# Patient Record
Sex: Male | Born: 1960 | Race: Black or African American | Hispanic: No | Marital: Single | Smoking: Former smoker
Health system: Southern US, Community
[De-identification: ages and names within clinical notes are randomized; demographics above are authoritative.]

---

## 2008-04-29 ENCOUNTER — Ambulatory Visit (HOSPITAL_COMMUNITY): Admission: RE | Admit: 2008-04-29 | Discharge: 2008-04-29 | Payer: Self-pay | Admitting: *Deleted

## 2017-02-20 ENCOUNTER — Encounter (HOSPITAL_COMMUNITY): Payer: Self-pay | Admitting: Emergency Medicine

## 2017-02-20 ENCOUNTER — Emergency Department (HOSPITAL_COMMUNITY)
Admission: EM | Admit: 2017-02-20 | Discharge: 2017-02-20 | Disposition: A | Discharge status: Home / Self Care | Payer: 59 | Attending: Emergency Medicine | Admitting: Emergency Medicine

## 2017-02-20 DIAGNOSIS — Z880 Allergy status to penicillin: Secondary | ICD-10-CM | POA: Insufficient documentation

## 2017-02-20 DIAGNOSIS — H9203 Otalgia, bilateral: Secondary | ICD-10-CM | POA: Diagnosis not present

## 2017-02-20 DIAGNOSIS — J029 Acute pharyngitis, unspecified: Secondary | ICD-10-CM | POA: Insufficient documentation

## 2017-02-20 DIAGNOSIS — B349 Viral infection, unspecified: Secondary | ICD-10-CM | POA: Insufficient documentation

## 2017-02-20 LAB — RAPID STREP SCREEN (MED CTR MEBANE ONLY): STREPTOCOCCUS, GROUP A SCREEN (DIRECT): NEGATIVE

## 2017-02-20 MED ORDER — METHOCARBAMOL 500 MG PO TABS: 500.0000 mg | ORAL_TABLET | Freq: Two times a day (BID) | ORAL | 0 refills | Status: DC

## 2017-02-20 MED ORDER — DEXAMETHASONE SODIUM PHOSPHATE 10 MG/ML IJ SOLN
10.0000 mg | Freq: Once | INTRAMUSCULAR | Status: AC
  Administered 2017-02-20: 10 mg via INTRAMUSCULAR
  Filled 2017-02-20: qty 1

## 2017-02-20 MED ORDER — METHOCARBAMOL 500 MG PO TABS
500.0000 mg | ORAL_TABLET | Freq: Once | ORAL | Status: AC
  Administered 2017-02-20: 500 mg via ORAL
  Filled 2017-02-20: qty 1

## 2017-02-20 NOTE — ED Provider Notes (Signed)
[facility]-EMERGENCY DEPT Provider Note   CSN: [id number] Arrival date & time: 02/20/17  1313     History   Chief Complaint Chief Complaint  Patient presents with  . Sore Throat  . Ear Pain    HPI Jerome Griffith is a 56 y.o. male who presents with 2 days of sore throat and bilateral ear pain. Patient also reports generalized malaise patient states that he feels weak, tired and sore. His been taking Aleve with minimal relief. Patient states that he has been able to tolerate his secretions and PO. He denies any difficulty swelling but states that his pain is worsened with swallowing. Patient reports subjective fever chills last night but did not actually measure a temperature. Patient reports that he had dental bridge inserted inserted on 02/15/17 but that it was simply putting in a mouth piece and did not require any surgery. Patient notes that yesterday  he had a rash/irritation to the posterior neck where his headphones sit across his neck. He also notes yesterday  he had a rash to the left side of his groin. No penile discharge or pain. Patient is currently sexually active with women. He denies any oral sex. He denies any recent insect bites. Patient denies any headache, difficulty breathing, chest pain, vision changes, nausea/vomiting, dysuria, hematuria, facial or neck swelling.  The history is provided by the patient.    No past medical history on file.  There are no active problems to display for this patient.   No past surgical history on file.     Home Medications    Prior to Admission medications   Medication Sig Start Date End Date Taking? Authorizing Provider  methocarbamol (ROBAXIN) 500 MG tablet Take 1 tablet (500 mg total) by mouth 2 (two) times daily. 02/20/17   Maxwell CaulLayden, Javaya [place] A, PA-C    Family History No family history on file.  Social History Social History  Substance Use Topics  . Smoking status: Former Games developermoker  . Smokeless tobacco: Not on file  . Alcohol use  No     Allergies   Penicillins   Review of Systems Review of Systems  Constitutional: Positive for fever (subjective). Negative for chills.  HENT: Positive for ear pain and sore throat. Negative for congestion, facial swelling, trouble swallowing and voice change.   Eyes: Negative for visual disturbance.  Respiratory: Negative for cough and shortness of breath.   Cardiovascular: Negative for chest pain.  Gastrointestinal: Negative for abdominal pain, diarrhea, nausea and vomiting.  Genitourinary: Negative for discharge, dysuria, hematuria, penile pain and penile swelling.  Musculoskeletal: Positive for myalgias. Negative for back pain and neck pain.  Skin: Positive for rash.  Neurological: Negative for dizziness, weakness and numbness.     Physical Exam Updated Vital Signs BP (!) 132/92 (BP Location: Right Arm)   Pulse 94   Temp 99.4 F (37.4 C) (Oral)   Resp 16   SpO2 100%   Physical Exam  Constitutional: He is oriented to person, place, and time. He appears well-developed and well-nourished.  Sitting comfortably on examination table  HENT:  Head: Normocephalic and atraumatic.  Right Ear: Tympanic membrane normal. No mastoid tenderness. Tympanic membrane is not erythematous. No hemotympanum.  Left Ear: Tympanic membrane normal. No mastoid tenderness. Tympanic membrane is not erythematous. No hemotympanum.  Mouth/Throat: Uvula is midline and mucous membranes are normal. Oropharyngeal exudate, posterior oropharyngeal edema and posterior oropharyngeal erythema present.  Uvula is midline. No trismus. No evidence of peritonsillar absces. No tenderness to palpation of  the mastoid process bilaterally. No overlying warmth or erythema noted.  Eyes: Pupils are equal, round, and reactive to light. Conjunctivae, EOM and lids are normal.  Neck: Full passive range of motion without pain. Neck supple. No neck rigidity.  Cardiovascular: Normal rate, regular rhythm, normal heart sounds and  normal pulses.  Exam reveals no gallop and no friction rub.   No murmur heard. Pulmonary/Chest: Effort normal and breath sounds normal.  No evidence of respiratory distress. Able to speak in full sentences without difficulty.  Abdominal: Soft. Normal appearance. There is no tenderness. There is no rigidity and no guarding. Hernia confirmed negative in the right inguinal area and confirmed negative in the left inguinal area.  Genitourinary: Testes normal and penis normal. Right testis shows no mass and no tenderness. Left testis shows no mass and no tenderness. Circumcised. No penile erythema.  Genitourinary Comments: The exam was performed with a chaperone present. Normal male genitalia. Penis without any evidence of lesions, rash or ulcers. No rash to the groin noted.   Musculoskeletal: Normal range of motion.  Neurological: He is alert and oriented to person, place, and time.  Skin: Skin is warm and dry. Capillary refill takes less than 2 seconds.  Small curvature distribution of papules on the posterior neck where patient lays his headphones. No rash noted to anterior chest, back, abdomen, extremities. No rash noted to palms of hands or soles of feet.  Psychiatric: He has a normal mood and affect. His speech is normal.  Nursing note and vitals reviewed.    ED Treatments / Results  Labs (all labs ordered are listed, but only abnormal results are displayed) Labs Reviewed  RAPID STRE[facility]EN (NOT [facility])  CULTURE, GROUP A STREP [facility])    EKG  EKG Interpretation None       Radiology No results found.  Procedures Procedures (including critical care time)  Medications Ordered in ED Medications  methocarbamol (ROBAXIN) tablet 500 mg (500 mg Oral Given 02/20/17 1526)  dexamethasone (DECADRON) injection 10 mg (10 mg Intramuscular Given 02/20/17 1546)     Initial Impression / Assessment and Plan / ED Course  I have reviewed the triage vital signs and the nursing notes.  Pertinent  labs & imaging results that were available during my care of the patient were reviewed by me and considered in my medical decision making (see chart for details).     56 year old male who presents with 2 days of general malaise, sore throat, bilateral ear pain. Patient is afebrile, non-toxic appearing, sitting comfortably on examination table. Vital signs reviewed and stable. Consider pharyngitis versus tonsillitis versus upper respiratory syndrome versus viral syndrome. History/physical exam are not concerning for pneumonia, peritonsillar abscess, Ludwig angina. Patient did have a dental bridge inserted yesterday but did not require any surgery. Evidence of acute otitis media or mastoiditis. Plan to order a rapid strep for evaluation. Discussed patient with Dr. Ethelda ChickJacubowitz. Agrees with plan.   Patient did complain of some rash to the posterior neck and groin area. The neck appears to be consistent with contact dermatitis. It is in the distribution of where he wears his headphones and nowhere else. On GU exam, there is no evidence of rash. Patient himself states that the rash has disappeared. History/physical exam are not concerning for RMSF.  Rapid strep is negative. Discussed results with patient. He is asking for something for pain and to help with the knowledge muscle aches. He is asking to have some Tamiflu. Explained to him that we  will not give any Tamiflu since it is not indicated in this incident. Will plan to give a short course of muscle relaxers to help with pain. Patient also requesting that we get something to help make swallowing easier. Patient given a dose of Decadron here in the department. Patient is stable for discharge at this time. He is hemodynamically stable. He has been able to tolerate PO while in the department. Provided patient with a list of clinic resources to use if he does not have a PCP. Instructed to call them today to arrange follow-up in the next 24-48 hours. Strict eturn  precautions discussed. Patient expresses understanding and agreement to plan.    Final Clinical Impressions(s) / ED Diagnoses   Final diagnoses:  Viral pharyngitis  Viral syndrome    New Prescriptions Discharge Medication List as of 02/20/2017  3:41 PM    START taking these medications   Details  methocarbamol (ROBAXIN) 500 MG tablet Take 1 tablet (500 mg total) by mouth 2 (two) times daily., Starting Wed 02/20/2017, Print         Maxwell Caul, PA-C 02/20/17 1741    Doug Sou, MD 02/21/17 [phone]

## 2017-02-20 NOTE — ED Notes (Signed)
See edp assessment 

## 2017-02-20 NOTE — Discharge Instructions (Signed)
You can take Tylenol or Ibuprofen as directed for pain. You can alternate Tylenol and Ibuprofen every 4 hours for additional pain relief.   Take Robaxin as directed.   Make sure you are drinking plenty of fluids and staying hydrated.   Follow-up with your primary care doctor in 2 days. Call their office and let them know you were seen in the ED. If you do not have one you can use one provided in the paper work below.   Return to the Emergency Department for any worsening pain, fever, difficulty swallowing, difficulty breathing, difficulty eating, neck or facial swelling or any other worsening or concerning symptoms.

## 2017-02-20 NOTE — ED Provider Notes (Signed)
[place] of sore throat pain on swallowing and bilateral ear pain. Onset 2 days ago. Denies cough denies shortness of breath. On exam alert no distress handling secretions well. Uvula midline. There is whitish exudate on soft palate and on uvula. Bilateral tympanic membranes normal.   Doug SouJacubowitz, Elycia Woodside, MD 02/20/17 1430

## 2017-02-20 NOTE — ED Triage Notes (Signed)
Pt arrives c/o sorethroat, bodyaches, fever for the last 2 days. States has tried Ryder Systemaleve with little relief. VSS, pt NAD at present.

## 2017-02-20 NOTE — ED Notes (Signed)
ED Provider at bedside. 

## 2017-02-22 LAB — CULTURE, GROUP A STREP (THRC)

## 2018-01-20 ENCOUNTER — Other Ambulatory Visit: Payer: Self-pay

## 2018-01-20 ENCOUNTER — Encounter (HOSPITAL_COMMUNITY): Payer: Self-pay | Admitting: Emergency Medicine

## 2018-01-20 ENCOUNTER — Emergency Department (HOSPITAL_COMMUNITY): Payer: 59

## 2018-01-20 ENCOUNTER — Emergency Department (HOSPITAL_COMMUNITY)
Admission: EM | Admit: 2018-01-20 | Discharge: 2018-01-20 | Disposition: A | Discharge status: Home / Self Care | Payer: 59 | Attending: Emergency Medicine | Admitting: Emergency Medicine

## 2018-01-20 DIAGNOSIS — Y939 Activity, unspecified: Secondary | ICD-10-CM | POA: Diagnosis not present

## 2018-01-20 DIAGNOSIS — Y929 Unspecified place or not applicable: Secondary | ICD-10-CM | POA: Insufficient documentation

## 2018-01-20 DIAGNOSIS — S81811A Laceration without foreign body, right lower leg, initial encounter: Secondary | ICD-10-CM | POA: Diagnosis present

## 2018-01-20 DIAGNOSIS — Z87891 Personal history of nicotine dependence: Secondary | ICD-10-CM | POA: Insufficient documentation

## 2018-01-20 DIAGNOSIS — Y999 Unspecified external cause status: Secondary | ICD-10-CM | POA: Insufficient documentation

## 2018-01-20 DIAGNOSIS — Z23 Encounter for immunization: Secondary | ICD-10-CM | POA: Diagnosis not present

## 2018-01-20 DIAGNOSIS — W25XXXA Contact with sharp glass, initial encounter: Secondary | ICD-10-CM | POA: Diagnosis not present

## 2018-01-20 MED ORDER — LIDOCAINE HCL 2 % IJ SOLN
10.0000 mL | Freq: Once | INTRAMUSCULAR | Status: AC
  Administered 2018-01-20: 200 mg
  Filled 2018-01-20: qty 20

## 2018-01-20 MED ORDER — TETANUS-DIPHTH-ACELL PERTUSSIS 5-2.5-18.5 LF-MCG/0.5 IM SUSP
0.5000 mL | Freq: Once | INTRAMUSCULAR | Status: AC
  Administered 2018-01-20: 0.5 mL via INTRAMUSCULAR
  Filled 2018-01-20: qty 0.5

## 2018-01-20 NOTE — ED Notes (Signed)
Pt verbalizes understanding of d/c instructions. Pt ambulatory at d/c with all belongings.   

## 2018-01-20 NOTE — ED Provider Notes (Signed)
Patient placed in Quick Look pathway, seen and evaluated   Chief Complaint:  Leg laceration  HPI:    Patient states that he was cleaning out his storage container when a portion of glass attached to an armoire  [place]ell into a trash can. He attempted to lift the trash bag and in the process sustained a laceration to his right  Lower extremity with what he believes to be glass. Endorses mild pain. Denies any numbness or tingling. Unsure of his tetanus is up-to-date. He is not on blood thinners.  ROS:  positive for wound  Negative for numbness, tingling  Physical Exam:   Gen: No distress  Neuro: Awake and Alert  Skin: Warm    Focused Exam:  2+ DP/PT pulses bilaterally. 6 cm linear laceration to the anterior right lower extremity distal to the knee. Subcutaneous fat visible. Actively bleeding.   Initiation of care has begun. The patient has been counseled on the process, plan, and necessity for staying for the completion/evaluation, and the remainder of the medical screening examination    Bennye AlmFawze, Jerriyah Louis A, PA-C 01/20/18 2036    Mesner, Barbara CowerJason, MD 01/20/18 2142

## 2018-01-20 NOTE — ED Notes (Signed)
See EDP assessment / note.   

## 2018-01-20 NOTE — Discharge Instructions (Addendum)
Suture removal in 8 days  °

## 2018-01-20 NOTE — ED Triage Notes (Signed)
Patient to ED c/o R lower leg laceration (approximately 3 inches) - states he picked up a bag of glass and accidentally brushed it up against his leg. Patient applied pressure by tying a sheet - wound still bleeding upon removal, pressure bandage applied in triage. Patient reports tingling pain with walking. CSM intact distal to injury. Ambulatory without difficulty. Not on blood thinners.

## 2018-01-20 NOTE — ED Notes (Signed)
See EDP assessment 

## 2018-01-21 NOTE — ED Provider Notes (Signed)
[facility] Provider Note   CSN: [id number] Arrival date & time: 01/20/18  2014     History   Chief Complaint Chief Complaint  Patient presents with  . Laceration    HPI Jerome Griffith is a 57 y.o. male.  The history is provided by the patient. No language interpreter was used.  Laceration   The incident occurred 1 to 2 hours ago. The laceration is located on the right leg. The laceration is 8 cm in size. The laceration mechanism was a broken glass. The pain is mild. The pain has been constant since onset. He reports no foreign bodies present. His tetanus status is out of date.   Pt reports he cut his leg over a piece of broken glass in a garbage bag.  History reviewed. No pertinent past medical history.  There are no active problems to display for this patient.   History reviewed. No pertinent surgical history.      Home Medications    Prior to Admission medications   Medication Sig Start Date End Date Taking? Authorizing Provider  methocarbamol (ROBAXIN) 500 MG tablet Take 1 tablet (500 mg total) by mouth 2 (two) times daily. 02/20/17   Maxwell CaulLayden, Lindsey A, PA-C    Family History No family history on file.  Social History Social History   Tobacco Use  . Smoking status: Former Smoker  Substance Use Topics  . Alcohol use: No  . Drug use: No     Allergies   Penicillins   Review of Systems Review of Systems  Skin: Positive for wound.  All other systems reviewed and are negative.    Physical Exam Updated Vital Signs BP (!) 140/95 (BP Location: Right Arm)   Pulse 68   Temp 98.2 F (36.8 C) (Oral)   Resp 16   SpO2 99%   Physical Exam  Constitutional: He is oriented to person, place, and time. He appears well-developed and well-nourished.  HENT:  Head: Normocephalic.  Musculoskeletal: He exhibits tenderness.  8cm laceration right mid lower leg, gapping  From nv and nv and ns intact   Neurological: He is  alert and oriented to person, place, and time.  Psychiatric: He has a normal mood and affect.  Nursing note and vitals reviewed.    ED Treatments / Results  Labs (all labs ordered are listed, but only abnormal results are displayed) Labs Reviewed - No data to display  EKG None  Radiology Dg Tibia/fibula Right  Result Date: 01/20/2018 CLINICAL DATA:  57 year old male with laceration of the proximal tibia. EXAM: RIGHT TIBIA AND FIBULA - 2 VIEW COMPARISON:  None. FINDINGS: There is no acute fracture or dislocation. The bones are well mineralized. Mild arthritic changes of the knee. There is laceration of the skin of the anterior aspect of the proximal tibia. No radiopaque foreign object. IMPRESSION: 1. No acute/traumatic osseous pathology. 2. Laceration of the skin over the proximal tibia. No radiopaque foreign object. Electronically Signed   By: Elgie CollardArash  Radparvar M.D.   On: 01/20/2018 21:46    Procedures .Marland Kitchen.Laceration Repair Date/Time: 01/21/2018 1:44 AM Performed by: Elson AreasSofia, Duglas Heier K, PA-C Authorized by: Elson AreasSofia, Gladys Gutman K, PA-C   Consent:    Consent obtained:  Verbal   Consent given by:  Patient   Risks discussed:  Pain   Alternatives discussed:  No treatment Laceration details:    Location:  Leg   Length (cm):  8   Depth (mm):  5 Repair type:  Repair type:  Simple Pre-procedure details:    Preparation:  Patient was prepped and draped in usual sterile fashion Exploration:    Wound exploration: wound explored through full range of motion and entire depth of wound probed and visualized     Contaminated: no   Treatment:    Area cleansed with:  Betadine   Irrigation solution:  Sterile saline   Irrigation method:  Syringe   Visualized foreign bodies/material removed: no   Skin repair:    Repair method:  Sutures   Suture size:  4-0   Suture material:  Prolene   Suture technique:  Simple interrupted Approximation:    Approximation:  Loose Post-procedure details:    Patient  tolerance of procedure:  Tolerated well, no immediate complications   (including critical care time)  Medications Ordered in ED Medications  Tdap (BOOSTRIX) injection 0.5 mL (0.5 mLs Intramuscular Given 01/20/18 2345)  lidocaine (XYLOCAINE) 2 % (with pres) injection 200 mg (200 mg Infiltration Given by Other 01/20/18 2309)     Initial Impression / Assessment and Plan / ED Course  I have reviewed the triage vital signs and the nursing notes.  Pertinent labs & imaging results that were available during my care of the patient were reviewed by me and considered in my medical decision making (see chart for details).     Suture removal in 8 days.    Final Clinical Impressions(s) / ED Diagnoses   Final diagnoses:  Laceration of right lower extremity, initial encounter    ED Discharge Orders    None    An After Visit Summary was printed and given to the patient.    Elson Areas, PA-C 01/21/18 0145    Arby Barrette, MD 01/22/18 [phone]

## 2018-06-24 ENCOUNTER — Encounter (HOSPITAL_COMMUNITY): Payer: Self-pay | Admitting: *Deleted

## 2018-06-24 ENCOUNTER — Emergency Department (HOSPITAL_COMMUNITY)
Admission: EM | Admit: 2018-06-24 | Discharge: 2018-06-24 | Disposition: A | Discharge status: Home / Self Care | Payer: 59 | Attending: Emergency Medicine | Admitting: Emergency Medicine

## 2018-06-24 ENCOUNTER — Other Ambulatory Visit: Payer: Self-pay

## 2018-06-24 DIAGNOSIS — K029 Dental caries, unspecified: Secondary | ICD-10-CM | POA: Insufficient documentation

## 2018-06-24 DIAGNOSIS — K047 Periapical abscess without sinus: Secondary | ICD-10-CM

## 2018-06-24 DIAGNOSIS — K0381 Cracked tooth: Secondary | ICD-10-CM | POA: Insufficient documentation

## 2018-06-24 DIAGNOSIS — Z79899 Other long term (current) drug therapy: Secondary | ICD-10-CM | POA: Insufficient documentation

## 2018-06-24 DIAGNOSIS — Z87891 Personal history of nicotine dependence: Secondary | ICD-10-CM | POA: Insufficient documentation

## 2018-06-24 MED ORDER — CLINDAMYCIN HCL 150 MG PO CAPS: 300.0000 mg | ORAL_CAPSULE | Freq: Three times a day (TID) | ORAL | 0 refills | Status: DC

## 2018-06-24 MED ORDER — NAPROXEN 500 MG PO TABS: 500.0000 mg | ORAL_TABLET | Freq: Two times a day (BID) | ORAL | 0 refills | Status: DC

## 2018-06-24 NOTE — Discharge Instructions (Signed)
Take the prescribed medication as directed. Follow-up with your dentist on Friday as scheduled. Return to the ED for new or worsening symptoms.

## 2018-06-24 NOTE — ED Triage Notes (Signed)
Pt reports R upper dental pain since yesterday.  Started to having facial swelling today.  He has a bad tooth and is getting it pulled this Friday.

## 2018-06-24 NOTE — ED Provider Notes (Signed)
[facility] Provider Note   CSN: [id number] Arrival date & time: 06/24/18  2154     History   Chief Complaint Chief Complaint  Patient presents with  . Dental Pain    HPI Jerome Griffith is a 57 y.o. male.  The history is provided by the patient and medical records.  Dental Pain       57 year old male presented to the ED with right upper dental pain.  Reports yesterday when he woke up he noticed that his face and a little swollen he has increased pain in his right upper teeth.  Reports tooth broke quite a while ago but is never had any issues until recently.  He denies any fever.  States he does have increased pain when the air touches his tooth or when trying to chew on his right side.  He denies any difficulty swallowing.  States he has a dentist appointment on Friday but cannot wait until then.  He has not tried anything for symptoms at home.  History reviewed. No pertinent past medical history.  There are no active problems to display for this patient.   History reviewed. No pertinent surgical history.      Home Medications    Prior to Admission medications   Medication Sig Start Date End Date Taking? Authorizing Provider  methocarbamol (ROBAXIN) 500 MG tablet Take 1 tablet (500 mg total) by mouth 2 (two) times daily. 02/20/17   Maxwell CaulLayden, Lindsey A, PA-C    Family History No family history on file.  Social History Social History   Tobacco Use  . Smoking status: Former Games developermoker  . Smokeless tobacco: Never Used  Substance Use Topics  . Alcohol use: No  . Drug use: No     Allergies   Penicillins   Review of Systems Review of Systems  HENT: Positive for dental problem.   All other systems reviewed and are negative.    Physical Exam Updated Vital Signs BP (!) 175/101 (BP Location: Left Arm)   Pulse (!) 102   Temp 98.6 F (37 C) (Oral)   Resp 18   SpO2 98%   Physical Exam  Constitutional: He is oriented to  person, place, and time. He appears well-developed and well-nourished.  HENT:  Head: Normocephalic and atraumatic.  Mouth/Throat: Oropharynx is clear and moist.  Teeth largely in poor dentition, tooth 13 and 14 are broken and severely decayed, surrounding gingiva swollen with extension into the inner cheek, handling secretions appropriately, no trismus, mid swelling to right mid-face without extension into the neck, normal phonation without stridor  Eyes: Pupils are equal, round, and reactive to light. Conjunctivae and EOM are normal.  Neck: Normal range of motion.  Cardiovascular: Normal rate, regular rhythm and normal heart sounds.  Pulmonary/Chest: Effort normal and breath sounds normal.  Abdominal: Soft. Bowel sounds are normal.  Musculoskeletal: Normal range of motion.  Neurological: He is alert and oriented to person, place, and time.  Skin: Skin is warm and dry.  Psychiatric: He has a normal mood and affect.  Nursing note and vitals reviewed.    ED Treatments / Results  Labs (all labs ordered are listed, but only abnormal results are displayed) Labs Reviewed - No data to display  EKG None  Radiology No results found.  Procedures Procedures (including critical care time)  Medications Ordered in ED Medications - No data to display   Initial Impression / Assessment and Plan / ED Course  I have reviewed the  triage vital signs and the nursing notes.  Pertinent labs & imaging results that were available during my care of the patient were reviewed by me and considered in my medical decision making (see chart for details).  57 year old male here with right upper dental pain.  Has 2 teeth that are severely decayed with gingival swelling.  He is maintaining his airway and has normal phonation without stridor.  No difficulty swallowing, handling secretions well.  No signs or symptoms suggestive of Ludwig's angina at this time.  Will start on antibiotics and have him follow-up  with his dentist on Friday as scheduled.  He will return here for any new or worsening symptoms.  Final Clinical Impressions(s) / ED Diagnoses   Final diagnoses:  Dental infection    ED Discharge Orders         Ordered    clindamycin (CLEOCIN) 150 MG capsule  3 times daily     06/24/18 2330    naproxen (NAPROSYN) 500 MG tablet  2 times daily with meals     06/24/18 2330           Garlon Hatchet, PA-C 06/24/18 2341    Gilda Crease, MD 06/25/18 [phone]

## 2018-06-25 ENCOUNTER — Emergency Department (HOSPITAL_COMMUNITY)
Admission: EM | Admit: 2018-06-25 | Discharge: 2018-06-25 | Disposition: A | Discharge status: Home / Self Care | Payer: Self-pay | Attending: Emergency Medicine | Admitting: Emergency Medicine

## 2018-06-25 ENCOUNTER — Encounter (HOSPITAL_COMMUNITY): Payer: Self-pay | Admitting: Emergency Medicine

## 2018-06-25 DIAGNOSIS — T7840XA Allergy, unspecified, initial encounter: Secondary | ICD-10-CM | POA: Insufficient documentation

## 2018-06-25 DIAGNOSIS — Z87891 Personal history of nicotine dependence: Secondary | ICD-10-CM | POA: Insufficient documentation

## 2018-06-25 DIAGNOSIS — Z79899 Other long term (current) drug therapy: Secondary | ICD-10-CM | POA: Insufficient documentation

## 2018-06-25 MED ORDER — DIPHENHYDRAMINE HCL 25 MG PO CAPS
50.0000 mg | ORAL_CAPSULE | Freq: Once | ORAL | Status: AC
Start: 2018-06-25 — End: 2018-06-25
  Administered 2018-06-25: 50 mg via ORAL
  Filled 2018-06-25: qty 2

## 2018-06-25 MED ORDER — DEXAMETHASONE SODIUM PHOSPHATE 10 MG/ML IJ SOLN
10.0000 mg | Freq: Once | INTRAMUSCULAR | Status: AC
  Administered 2018-06-25: 10 mg via INTRAMUSCULAR
  Filled 2018-06-25: qty 1

## 2018-06-25 NOTE — ED Triage Notes (Signed)
Pt presents to ED for assessment of facial swelling, continuing since last night, and worse when he woke up this morning.  States his eyes were swollen shut, c/o increased swelling to gums.  States he took one dose of naproxen and clindamycin.  Patient denies SOB.

## 2018-06-25 NOTE — ED Notes (Addendum)
Pt here last night for dental pain and prescribed naproxen and clindamycin, pt also "clove" and "curamin" from [facility] which is a herbal store. Pt then woke up with severe facial swelling/edema. No itching or rash. Pt thinks he had an allergic reaction to 1 of the 4 medications. Airway intact. VSS, NAD.

## 2018-06-26 NOTE — ED Provider Notes (Signed)
[facility] EMERGENCY DEPARTMENT Provider Note   CSN: [id number] Arrival date & time: 06/25/18  1600     History   Chief Complaint Chief Complaint  Patient presents with  . Facial Swelling  . Allergic Reaction    HPI Jerome Griffith is a 57 y.o. male.  HPI  57 year old male comes in with chief complaint of facial swelling and allergic reaction. Patient was seen in the ER earlier and was given clindamycin for facial swelling that was thought to be because of dental infection.  Patient states that he took clindamycin after being discharged from the hospital last night.  When he woke up he noted that his entire his face was swollen, and he decided to come to the ER.  Patient denies any tongue swelling, shortness of breath, rash, difficulty in breathing.  History reviewed. No pertinent past medical history.  There are no active problems to display for this patient.   History reviewed. No pertinent surgical history.      Home Medications    Prior to Admission medications   Medication Sig Start Date End Date Taking? Authorizing Provider  clindamycin (CLEOCIN) 150 MG capsule Take 2 capsules (300 mg total) by mouth 3 (three) times daily. May dispense as 150mg  capsules 06/24/18   Garlon Hatchet, PA-C  methocarbamol (ROBAXIN) 500 MG tablet Take 1 tablet (500 mg total) by mouth 2 (two) times daily. 02/20/17   Maxwell Caul, PA-C  naproxen (NAPROSYN) 500 MG tablet Take 1 tablet (500 mg total) by mouth 2 (two) times daily with a meal. 06/24/18   Garlon Hatchet, PA-C    Family History History reviewed. No pertinent family history.  Social History Social History   Tobacco Use  . Smoking status: Former Games developer  . Smokeless tobacco: Never Used  Substance Use Topics  . Alcohol use: No  . Drug use: No     Allergies   Penicillins   Review of Systems Review of Systems  Constitutional: Positive for activity change.  Respiratory: Negative for shortness  of breath.   Skin: Negative for rash.  Allergic/Immunologic: Negative for environmental allergies, food allergies and immunocompromised state.     Physical Exam Updated Vital Signs BP (!) 141/90 (BP Location: Right Arm)   Pulse 86   Temp 97.9 F (36.6 C) (Oral)   Resp 17   SpO2 98%   Physical Exam  Constitutional: He is oriented to person, place, and time. He appears well-developed.  HENT:  Head: Atraumatic.  Patient has generalized facial swelling on both sides on the right appears slightly worse than left. There is no abscess for [place] to drain over the gingiva or the right upper quadrant where patient has eroded tooth.  Eyes: Pupils are equal, round, and reactive to light. EOM are normal.  Neck: Neck supple.  Cardiovascular: Normal rate.  Pulmonary/Chest: Effort normal. No stridor. He has no wheezes.  Neurological: He is alert and oriented to person, place, and time.  Skin: Skin is warm. No rash noted.  Nursing note and vitals reviewed.    ED Treatments / Results  Labs (all labs ordered are listed, but only abnormal results are displayed) Labs Reviewed - No data to display  EKG None  Radiology No results found.  Procedures Procedures (including critical care time)  Medications Ordered in ED Medications  dexamethasone (DECADRON) injection 10 mg (10 mg Intramuscular Given 06/25/18 2101)  diphenhydrAMINE (BENADRYL) capsule 50 mg (50 mg Oral Given 06/25/18 2102)  Initial Impression / Assessment and Plan / ED Course  I have reviewed the triage vital signs and the nursing notes.  Pertinent labs & imaging results that were available during my care of the patient were reviewed by me and considered in my medical decision making (see chart for details).     Patient comes in with chief complaint of facial swelling.  He was started on clindamycin and Naprosyn yesterday for dental infection.  He states that he woke up in the morning with worsening of the  swelling.  Patient did not have any angioedema.  I informed him that his symptoms could have been because of worsening infection versus allergic reaction to the medication.  Since patient started taking Naprosyn and clindamycin at the same time, it is unclear what might have caused him to have allergic reaction.  Patient is not comfortable taking the medication suspecting that this was likely an allergic type reaction.  We will give him IM Decadron and Benadryl and reassess.  LATE ENTRY: On reassessment patient swelling is come down.  He has informed me that he is probably not going to take any of the medication as prescribed.  He has an appointment with dentist coming up.  I discussed strict ER return precautions and patient is comfortable with plan for discharge.  Final Clinical Impressions(s) / ED Diagnoses   Final diagnoses:  Allergic reaction, initial encounter    ED Discharge Orders    None       Derwood KaplanNanavati, Bernetta Sutley, MD 06/26/18 [phone]

## 2018-10-21 IMAGING — DX DG TIBIA/FIBULA 2V*R*
4 series · 4 of 4 positions shown · non-contrast
Comparison: None.

CLINICAL DATA: 57-year-old male with laceration of the proximal
tibia.

EXAM:
RIGHT TIBIA AND FIBULA - 2 VIEW

[tibia ap (1 of 2)]
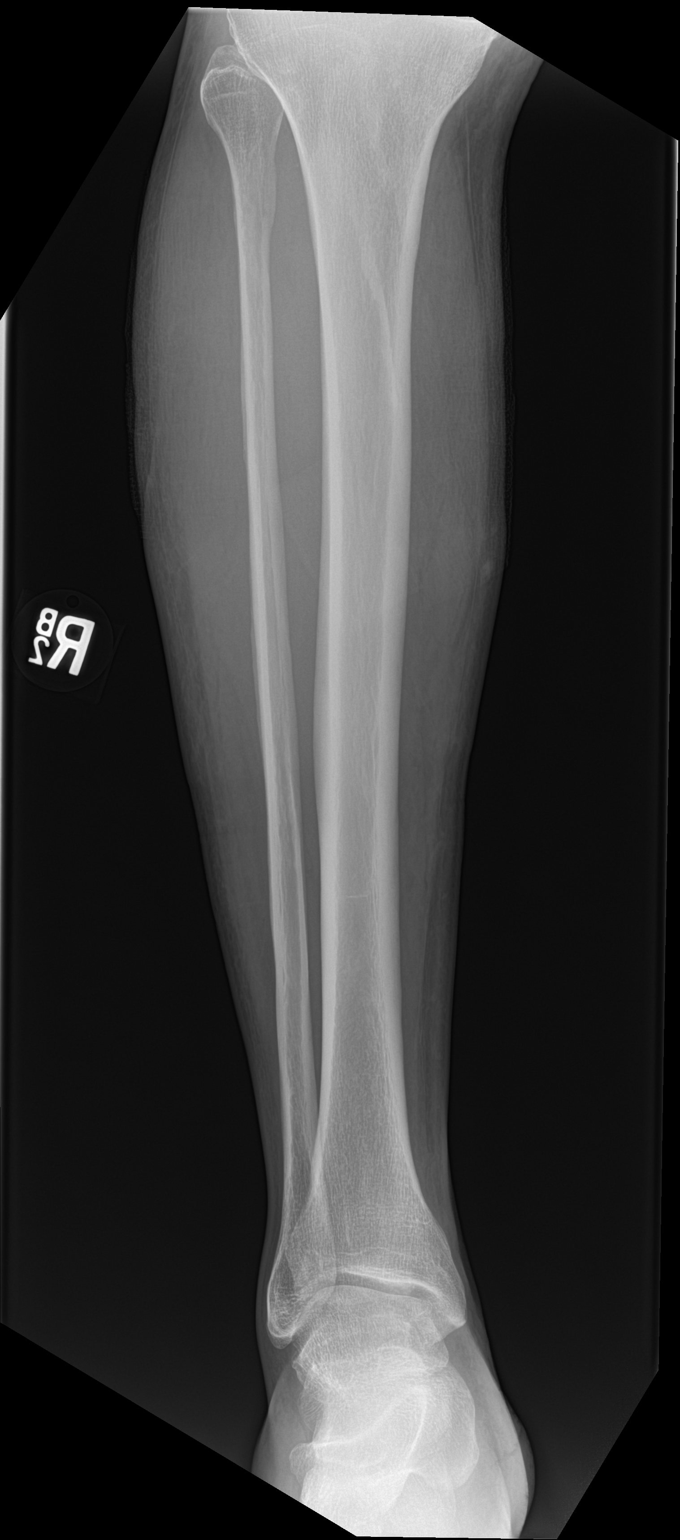

[tibia ap (2 of 2)]
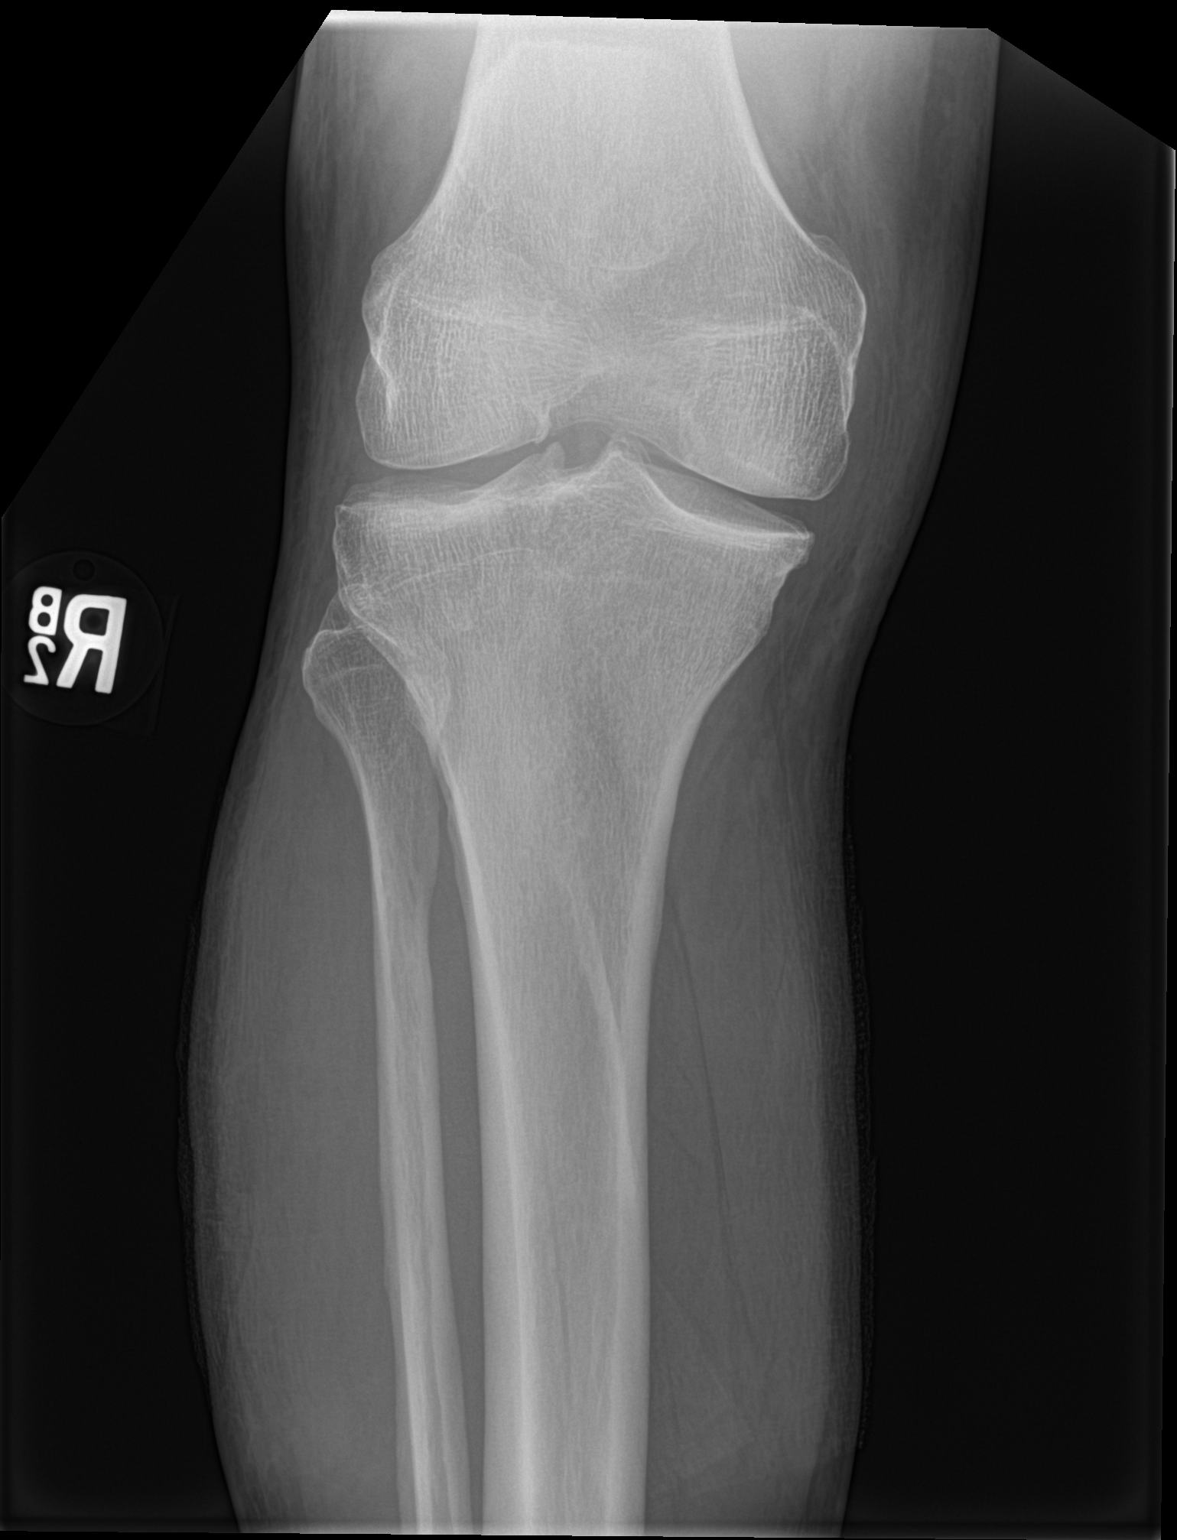

[tibia lat (1 of 2)]
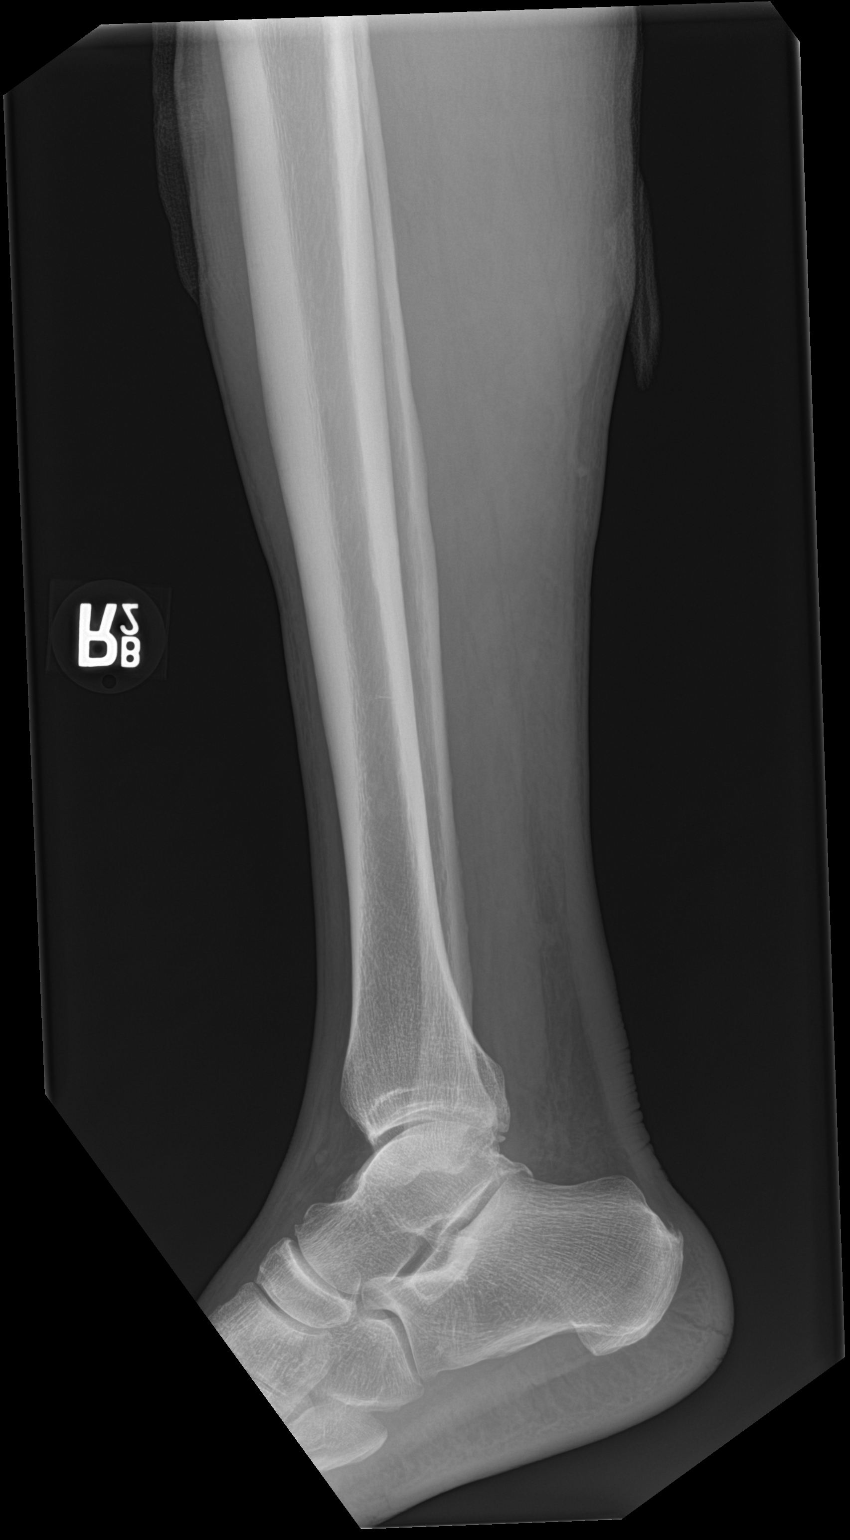

[tibia lat (2 of 2)]
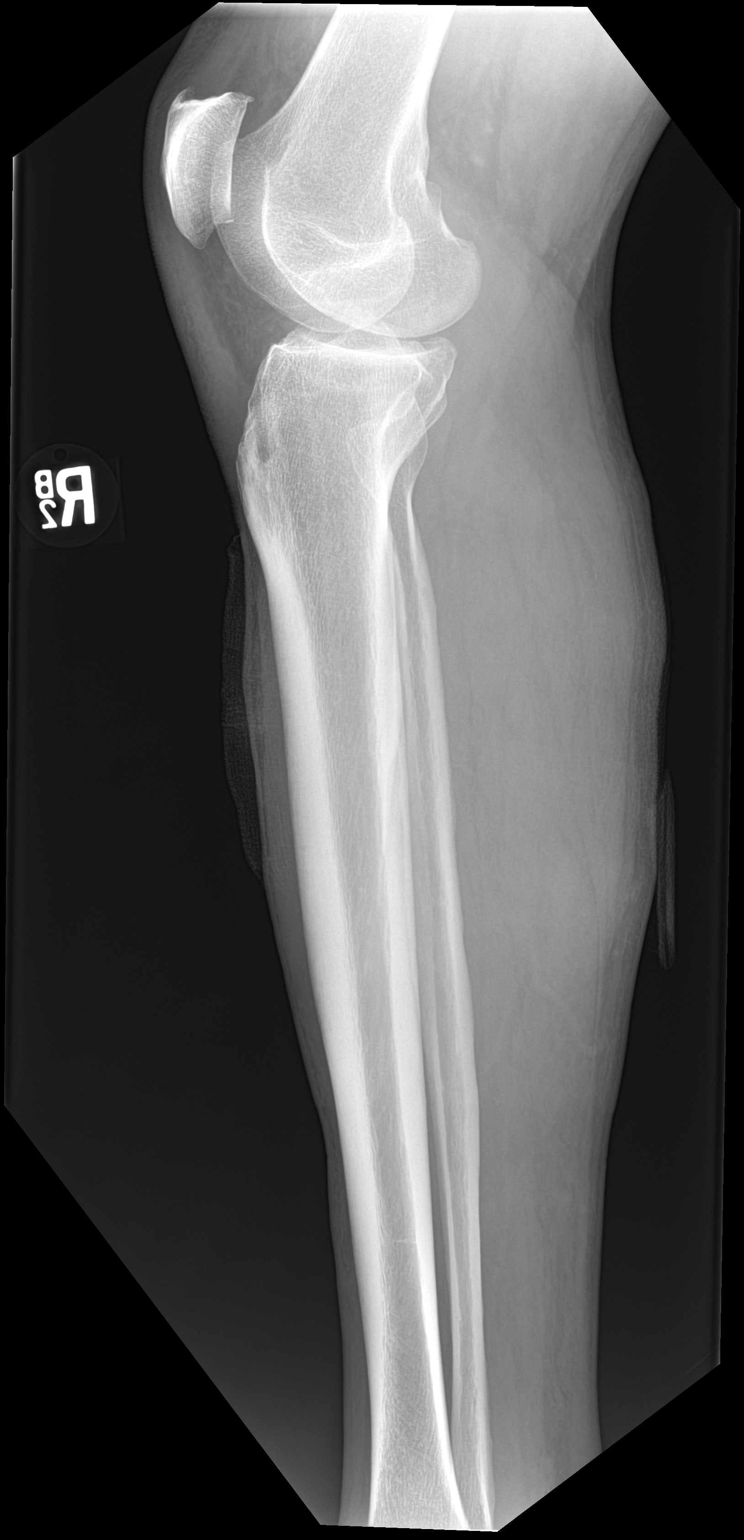

[4 of 4 positions shown; findings below may reference images not displayed]

FINDINGS: There is no acute fracture or dislocation. The bones are well
mineralized. Mild arthritic changes of the knee. There is laceration
of the skin of the anterior aspect of the proximal tibia. No
radiopaque foreign object.
IMPRESSION: 1. No acute/traumatic osseous pathology.
2. Laceration of the skin over the proximal tibia. No radiopaque
foreign object.

## 2019-12-03 ENCOUNTER — Other Ambulatory Visit: Payer: Self-pay

## 2019-12-03 ENCOUNTER — Encounter (HOSPITAL_COMMUNITY): Payer: Self-pay | Admitting: Emergency Medicine

## 2019-12-03 ENCOUNTER — Emergency Department (HOSPITAL_COMMUNITY)
Admission: EM | Admit: 2019-12-03 | Discharge: 2019-12-04 | Disposition: A | Discharge status: Home / Self Care | Payer: Self-pay | Attending: Emergency Medicine | Admitting: Emergency Medicine

## 2019-12-03 DIAGNOSIS — T7840XA Allergy, unspecified, initial encounter: Secondary | ICD-10-CM | POA: Insufficient documentation

## 2019-12-03 DIAGNOSIS — Z79899 Other long term (current) drug therapy: Secondary | ICD-10-CM | POA: Insufficient documentation

## 2019-12-03 DIAGNOSIS — R21 Rash and other nonspecific skin eruption: Secondary | ICD-10-CM | POA: Insufficient documentation

## 2019-12-03 DIAGNOSIS — Z87891 Personal history of nicotine dependence: Secondary | ICD-10-CM | POA: Insufficient documentation

## 2019-12-03 LAB — BASIC METABOLIC PANEL
Anion gap: 11 (ref 5–15)
BUN: 18 mg/dL (ref 6–20)
CO2: 23 mmol/L (ref 22–32)
Calcium: 9.4 mg/dL (ref 8.9–10.3)
Chloride: 109 mmol/L (ref 98–111)
Creatinine, Ser: 1.41 mg/dL — ABNORMAL HIGH (ref 0.61–1.24)
GFR calc Af Amer: >60 mL/min (ref >60)
GFR calc non Af Amer: 55 mL/min — ABNORMAL LOW (ref >60)
Glucose, Bld: 108 mg/dL — ABNORMAL HIGH (ref 70–99)
Potassium: 4.1 mmol/L (ref 3.5–5.1)
Sodium: 143 mmol/L (ref 135–145)

## 2019-12-03 LAB — CBC WITH DIFFERENTIAL/PLATELET
Abs Immature Granulocytes: 0.04 10*3/uL (ref 0.00–0.07)
Basophils Absolute: 0 10*3/uL (ref 0.0–0.1)
Basophils Relative: 1 %
Eosinophils Absolute: 0.3 10*3/uL (ref 0.0–0.5)
Eosinophils Relative: 6 %
HCT: 44.4 % (ref 39.0–52.0)
Hemoglobin: 14.8 g/dL (ref 13.0–17.0)
Immature Granulocytes: 1 %
Lymphocytes Relative: 34 %
Lymphs Abs: 2 10*3/uL (ref 0.7–4.0)
MCH: 32 pg (ref 26.0–34.0)
MCHC: 33.3 g/dL (ref 30.0–36.0)
MCV: 95.9 fL (ref 80.0–100.0)
Monocytes Absolute: 0.5 10*3/uL (ref 0.1–1.0)
Monocytes Relative: 9 %
Neutro Abs: 2.9 10*3/uL (ref 1.7–7.7)
Neutrophils Relative %: 49 %
Platelets: 228 10*3/uL (ref 150–400)
RBC: 4.63 MIL/uL (ref 4.22–5.81)
RDW: 12.8 % (ref 11.5–15.5)
WBC: 5.8 10*3/uL (ref 4.0–10.5)
nRBC: 0 % (ref 0.0–0.2)

## 2019-12-03 NOTE — ED Triage Notes (Signed)
Patient reports allergy to Salon[employer] pain patch , presents with itchy skin rashes at arms , neck and torso for 2 days unrelieved by OTC Benadryl , respirations unlabored . No fever or chills/no oral swelling .

## 2019-12-04 MED ORDER — METHYLPREDNISOLONE SODIUM SUCC 125 MG IJ SOLR
125.0000 mg | Freq: Once | INTRAMUSCULAR | Status: AC
  Administered 2019-12-04: 125 mg via INTRAVENOUS
  Filled 2019-12-04: qty 2

## 2019-12-04 MED ORDER — PREDNISONE 20 MG PO TABS: ORAL_TABLET | ORAL | 0 refills | Status: DC

## 2019-12-04 MED ORDER — ONDANSETRON HCL 4 MG/2ML IJ SOLN
4.0000 mg | Freq: Once | INTRAMUSCULAR | Status: AC
  Administered 2019-12-04: 4 mg via INTRAVENOUS
  Filled 2019-12-04: qty 2

## 2019-12-04 MED ORDER — FAMOTIDINE IN NACL 20-0.9 MG/50ML-% IV SOLN
20.0000 mg | INTRAVENOUS | Status: AC
  Administered 2019-12-04: 20 mg via INTRAVENOUS
  Filled 2019-12-04: qty 50

## 2019-12-04 MED ORDER — FAMOTIDINE 20 MG PO TABS: 20.0000 mg | ORAL_TABLET | Freq: Two times a day (BID) | ORAL | 0 refills | Status: DC

## 2019-12-04 MED ORDER — DIPHENHYDRAMINE HCL 50 MG/ML IJ SOLN
25.0000 mg | Freq: Once | INTRAMUSCULAR | Status: AC
  Administered 2019-12-04: 25 mg via INTRAVENOUS
  Filled 2019-12-04: qty 1

## 2019-12-04 MED ORDER — DIPHENHYDRAMINE HCL 25 MG PO TABS: 25.0000 mg | ORAL_TABLET | Freq: Four times a day (QID) | ORAL | 0 refills | Status: DC | PRN

## 2019-12-04 MED ORDER — SODIUM CHLORIDE 0.9 % IV BOLUS
1000.0000 mL | Freq: Once | INTRAVENOUS | Status: AC
  Administered 2019-12-04: 1000 mL via INTRAVENOUS

## 2019-12-04 MED ORDER — FAMOTIDINE 20 MG PO TABS: 20.0000 mg | ORAL_TABLET | Freq: Every day | ORAL | 0 refills | Status: DC
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
## 2019-12-04 NOTE — Discharge Instructions (Addendum)
Take the prescribed medication as directed.  Be careful with the benadryl, it can make drowsy.  Can reserve for night time use only if you prefer.  Can use topical hydrocortisone cream as well if you'd like.   Follow-up with your primary care doctor. Return to the ED for new or worsening symptoms.

## 2020-01-04 ENCOUNTER — Emergency Department (HOSPITAL_COMMUNITY)
Admission: EM | Admit: 2020-01-04 | Discharge: 2020-01-05 | Disposition: A | Discharge status: Home / Self Care | Payer: Self-pay | Attending: Emergency Medicine | Admitting: Emergency Medicine

## 2020-01-04 ENCOUNTER — Other Ambulatory Visit: Payer: Self-pay

## 2020-01-04 ENCOUNTER — Encounter (HOSPITAL_COMMUNITY): Payer: Self-pay | Admitting: Emergency Medicine

## 2020-01-04 DIAGNOSIS — Z79899 Other long term (current) drug therapy: Secondary | ICD-10-CM | POA: Insufficient documentation

## 2020-01-04 DIAGNOSIS — Z87891 Personal history of nicotine dependence: Secondary | ICD-10-CM | POA: Insufficient documentation

## 2020-01-04 DIAGNOSIS — R21 Rash and other nonspecific skin eruption: Secondary | ICD-10-CM | POA: Insufficient documentation

## 2020-01-04 NOTE — ED Triage Notes (Signed)
Pt treated for an allergic reaction to salonpas, states prednisone and famotidine improved but he is now out. No new complaints.

## 2020-01-05 MED ORDER — FAMOTIDINE 20 MG PO TABS: 20.0000 mg | ORAL_TABLET | Freq: Two times a day (BID) | ORAL | 2 refills | Status: DC
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
## 2020-01-05 NOTE — Discharge Instructions (Signed)
Glad your arm is doing better.  Make sure to sign up for mychart so you can see all your medical records at home. Can continue benadryl when needed for itching.  Pepcid can help with this too-- sent new script to your pharmacy. Follow-up with your primary care doctor. Return here for any new/acute changes.

## 2020-02-23 ENCOUNTER — Emergency Department (HOSPITAL_COMMUNITY): Payer: Self-pay

## 2020-02-23 ENCOUNTER — Emergency Department (HOSPITAL_COMMUNITY)
Admission: EM | Admit: 2020-02-23 | Discharge: 2020-02-23 | Disposition: A | Discharge status: Home / Self Care | Payer: Self-pay | Attending: Emergency Medicine | Admitting: Emergency Medicine

## 2020-02-23 ENCOUNTER — Other Ambulatory Visit: Payer: Self-pay

## 2020-02-23 ENCOUNTER — Encounter (HOSPITAL_COMMUNITY): Payer: Self-pay | Admitting: Emergency Medicine

## 2020-02-23 DIAGNOSIS — J111 Influenza due to unidentified influenza virus with other respiratory manifestations: Secondary | ICD-10-CM

## 2020-02-23 DIAGNOSIS — R6883 Chills (without fever): Secondary | ICD-10-CM | POA: Insufficient documentation

## 2020-02-23 DIAGNOSIS — Z20822 Contact with and (suspected) exposure to covid-19: Secondary | ICD-10-CM | POA: Insufficient documentation

## 2020-02-23 DIAGNOSIS — R52 Pain, unspecified: Secondary | ICD-10-CM | POA: Insufficient documentation

## 2020-02-23 DIAGNOSIS — R7401 Elevation of levels of liver transaminase levels: Secondary | ICD-10-CM

## 2020-02-23 DIAGNOSIS — Z87891 Personal history of nicotine dependence: Secondary | ICD-10-CM | POA: Insufficient documentation

## 2020-02-23 DIAGNOSIS — Z880 Allergy status to penicillin: Secondary | ICD-10-CM | POA: Insufficient documentation

## 2020-02-23 LAB — COMPREHENSIVE METABOLIC PANEL
ALT: 69 U/L — ABNORMAL HIGH (ref 0–44)
AST: 50 U/L — ABNORMAL HIGH (ref 15–41)
Albumin: 3.9 g/dL (ref 3.5–5.0)
Alkaline Phosphatase: 68 U/L (ref 38–126)
Anion gap: 13 (ref 5–15)
BUN: 12 mg/dL (ref 6–20)
CO2: 23 mmol/L (ref 22–32)
Calcium: 9.1 mg/dL (ref 8.9–10.3)
Chloride: 97 mmol/L — ABNORMAL LOW (ref 98–111)
Creatinine, Ser: 1.2 mg/dL (ref 0.61–1.24)
GFR calc Af Amer: >60 mL/min (ref >60)
GFR calc non Af Amer: >60 mL/min (ref >60)
Glucose, Bld: 95 mg/dL (ref 70–99)
Potassium: 4.1 mmol/L (ref 3.5–5.1)
Sodium: 133 mmol/L — ABNORMAL LOW (ref 135–145)
Total Bilirubin: 0.8 mg/dL (ref 0.3–1.2)
Total Protein: 6.7 g/dL (ref 6.5–8.1)

## 2020-02-23 LAB — CBC WITH DIFFERENTIAL/PLATELET
Abs Immature Granulocytes: 0.02 10*3/uL (ref 0.00–0.07)
Basophils Absolute: 0 10*3/uL (ref 0.0–0.1)
Basophils Relative: 0 %
Eosinophils Absolute: 0 10*3/uL (ref 0.0–0.5)
Eosinophils Relative: 0 %
HCT: 42.9 % (ref 39.0–52.0)
Hemoglobin: 14.6 g/dL (ref 13.0–17.0)
Immature Granulocytes: 0 %
Lymphocytes Relative: 26 %
Lymphs Abs: 1.3 10*3/uL (ref 0.7–4.0)
MCH: 31.8 pg (ref 26.0–34.0)
MCHC: 34 g/dL (ref 30.0–36.0)
MCV: 93.5 fL (ref 80.0–100.0)
Monocytes Absolute: 0.7 10*3/uL (ref 0.1–1.0)
Monocytes Relative: 14 %
Neutro Abs: 2.9 10*3/uL (ref 1.7–7.7)
Neutrophils Relative %: 60 %
Platelets: 172 10*3/uL (ref 150–400)
RBC: 4.59 MIL/uL (ref 4.22–5.81)
RDW: 12.8 % (ref 11.5–15.5)
WBC: 4.9 10*3/uL (ref 4.0–10.5)
nRBC: 0 % (ref 0.0–0.2)

## 2020-02-23 LAB — URINALYSIS, ROUTINE W REFLEX MICROSCOPIC
Bacteria, UA: NONE SEEN
Bilirubin Urine: NEGATIVE
Glucose, UA: NEGATIVE mg/dL
Ketones, ur: 5 mg/dL — AB
Leukocytes,Ua: NEGATIVE
Nitrite: NEGATIVE
Protein, ur: NEGATIVE mg/dL
Specific Gravity, Urine: 1.014 (ref 1.005–1.030)
pH: 6 (ref 5.0–8.0)

## 2020-02-23 LAB — SARS CORONAVIRUS 2 BY RT PCR (HOSPITAL ORDER, PERFORMED IN ~~LOC~~ HOSPITAL LAB): SARS Coronavirus 2: NEGATIVE

## 2020-02-23 LAB — LACTIC ACID, PLASMA: Lactic Acid, Venous: 0.9 mmol/L (ref 0.5–1.9)

## 2020-02-23 MED ORDER — IBUPROFEN 800 MG PO TABS
800.0000 mg | ORAL_TABLET | Freq: Once | ORAL | Status: AC
  Administered 2020-02-23: 800 mg via ORAL
  Filled 2020-02-23: qty 1

## 2020-02-23 NOTE — Discharge Instructions (Addendum)
Drink plenty of fluids. ° °Take ibuprofen or acetaminophen as needed for fever or aching. ° °Return if symptoms are getting worse. °

## 2020-02-23 NOTE — ED Triage Notes (Signed)
Pt arrives to ED with c/o of chills and body aches since yesterday afternoon.

## 2020-02-23 NOTE — ED Provider Notes (Signed)
[facility] EMERGENCY DEPARTMENT Provider Note   CSN: [id number] Arrival date & time: 02/23/20  1351     History Chief Complaint  Patient presents with  . Chills    Jerome Griffith is a 59 y.o. male who presents with fever and myalgias. He has not had COVID vaccine due to prolonged use of steroids after SJS like reaction to [facility] patch. Patient awoke today with fever, chills, body aches.  He is unsure if he was exposed to coronavirus but states that he was in line at the pharmacy at [facility] when the man was coughing all over everybody yesterday.  He denies urinary symptoms, nausea, abdominal pain. HPI     History reviewed. No pertinent past medical history.  There are no problems to display for this patient.   History reviewed. No pertinent surgical history.     No family history on file.  Social History   Tobacco Use  . Smoking status: Former Games developer  . Smokeless tobacco: Never Used  Substance Use Topics  . Alcohol use: No  . Drug use: No    Home Medications Prior to Admission medications   Medication Sig Start Date End Date Taking? Authorizing Provider  clindamycin (CLEOCIN) 150 MG capsule Take 2 capsules (300 mg total) by mouth 3 (three) times daily. May dispense as 150mg  capsules Patient not taking: Reported on 12/04/2019 06/24/18   14/3/19, PA-C  diphenhydrAMINE (BENADRYL) 25 MG tablet Take 1 tablet (25 mg total) by mouth every 6 (six) hours as needed. 12/04/19   12/06/19, PA-C  famotidine (PEPCID) 20 MG tablet Take 1 tablet (20 mg total) by mouth 2 (two) times daily. 01/05/20   01/07/20, PA-C  methocarbamol (ROBAXIN) 500 MG tablet Take 1 tablet (500 mg total) by mouth 2 (two) times daily. Patient not taking: Reported on 12/04/2019 02/20/17   04/22/17 A, PA-C  naproxen (NAPROSYN) 500 MG tablet Take 1 tablet (500 mg total) by mouth 2 (two) times daily with a meal. Patient not taking: Reported on 12/04/2019 06/24/18    14/3/19, PA-C  predniSONE (DELTASONE) 20 MG tablet Take 40 mg by mouth daily for 3 days, then 20mg  by mouth daily for 3 days, then 10mg  daily for 3 days 12/04/19   , PA-C    Allergies    Penicillins and Salonpas pain relief patch [thera-gesic]  Review of Systems   Review of Systems Ten systems reviewed and are negative for acute change, except as noted in the HPI.   Physical Exam Updated Vital Signs BP (!) 139/99 (BP Location: Right Arm)   Pulse 95   Temp (!) 103.1 F (39.5 C) (Oral)   Resp 18   SpO2 98%   Physical Exam Vitals and nursing note reviewed.  Constitutional:      General: He is not in acute distress.    Appearance: He is well-developed. He is not diaphoretic.  HENT:     Head: Normocephalic and atraumatic.  Eyes:     General: No scleral icterus.    Conjunctiva/sclera: Conjunctivae normal.  Cardiovascular:     Rate and Rhythm: Normal rate and regular rhythm.     Heart sounds: Normal heart sounds.  Pulmonary:     Effort: Pulmonary effort is normal. No respiratory distress.     Breath sounds: Normal breath sounds.  Abdominal:     Palpations: Abdomen is soft.     Tenderness: There is no abdominal tenderness.  Musculoskeletal:  Cervical back: Normal range of motion and neck supple.  Skin:    General: Skin is warm and dry.  Neurological:     Mental Status: He is alert.  Psychiatric:        Behavior: Behavior normal.     ED Results / Procedures / Treatments   Labs (all labs ordered are listed, but only abnormal results are displayed) Labs Reviewed  COMPREHENSIVE METABOLIC PANEL  CBC WITH DIFFERENTIAL/PLATELET  URINALYSIS, ROUTINE W REFLEX MICROSCOPIC  LACTIC ACID, PLASMA  POC SARS CORONAVIRUS 2 AG -  ED    EKG None  Radiology DG Chest Portable 1 View  Result Date: 02/23/2020 CLINICAL DATA:  59 year old male with fever. EXAM: PORTABLE CHEST 1 VIEW COMPARISON:  None. FINDINGS: No focal consolidation, pleural effusion, or  pneumothorax. The cardiac silhouette is within limits. No acute osseous pathology. IMPRESSION: No active disease. Electronically Signed   By: Elgie Collard M.D.   On: 02/23/2020 20:30    Procedures Procedures (including critical care time)  Medications Ordered in ED Medications  ibuprofen (ADVIL) tablet 800 mg (800 mg Oral Given 02/23/20 2038)    ED Course  I have reviewed the triage vital signs and the nursing notes.  Pertinent labs & imaging results that were available during my care of the patient were reviewed by me and considered in my medical decision making (see chart for details).    MDM Rules/Calculators/A&P                          59 year old male here with fever of unknown origin and flulike symptoms including myalgias and body aches.  I have ordered labs which include lactic acid, CMP, CBC, urinalysis and Covid testing which are pending.  I reviewed the patient's one-view portable chest x-ray plain film which shows no evidence of acute abnormality.  Patient given 800 mg of Advil with defervescence achieved.  I have given signout to Dr. Preston Fleeting who will assume care of the patient to review labs and disposition appropriately. Final Clinical Impression(s) / ED Diagnoses Final diagnoses:  None    Rx / DC Orders ED Discharge Orders    None       Arthor Captain, PA-C 02/25/20 1210    Dione Booze, MD 02/26/20 1341

## 2020-02-26 ENCOUNTER — Other Ambulatory Visit: Payer: Self-pay

## 2020-02-26 ENCOUNTER — Ambulatory Visit (HOSPITAL_COMMUNITY)
Admission: EM | Admit: 2020-02-26 | Discharge: 2020-02-26 | Disposition: A | Discharge status: Home / Self Care | Payer: Self-pay | Attending: Family Medicine | Admitting: Family Medicine

## 2020-02-26 ENCOUNTER — Encounter (HOSPITAL_COMMUNITY): Payer: Self-pay

## 2020-02-26 DIAGNOSIS — M25511 Pain in right shoulder: Secondary | ICD-10-CM

## 2020-02-26 DIAGNOSIS — G8929 Other chronic pain: Secondary | ICD-10-CM

## 2020-02-26 MED ORDER — METHYLPREDNISOLONE 4 MG PO TBPK
ORAL_TABLET | ORAL | 0 refills | Status: AC
Start: 2020-02-26 — End: ?

## 2020-02-26 MED ORDER — TRAMADOL-ACETAMINOPHEN 37.5-325 MG PO TABS: 2.0000 | ORAL_TABLET | Freq: Four times a day (QID) | ORAL | 0 refills | Status: AC | PRN

## 2020-02-26 NOTE — ED Provider Notes (Signed)
[facility]    CSN: [id number] Arrival date & time: 02/26/20  1955      History   Chief Complaint Chief Complaint  Patient presents with   Shoulder Pain    HPI Jerome Griffith is a 59 y.o. male.   HPI  Patient has chronic right shoulder pain.  He states he had a cortisone shot 10 years ago.  He states it helped.  He is here requesting a cortisone shot today.  He has had progressive pain over the last weeks.  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  He is pain when he tries to sleep at night.  Pain when trying to raise his right arm.  No numbness or weakness.  Denies trauma. He recently had a prolonged course of steroids for a skin rash/reaction.  He still has healing skin on his right shoulder. Explained to him that I am reluctant to inject medication through skin that is not entirely healed.  History reviewed. No pertinent past medical history.  There are no problems to display for this patient.   History reviewed. No pertinent surgical history.     Home Medications    Prior to Admission medications   Medication Sig Start Date End Date Taking? Authorizing Provider  Ibuprofen-Acetaminophen (ADVIL DUAL ACTION) 125-250 MG TABS Take 2 tablets by mouth every 8 (eight) hours as needed (fever, chills, pain).    [provider]  methylPREDNISolone (MEDROL DOSEPAK) 4 MG TBPK tablet tad 02/26/20   Eustace Moore, MD  traMADol-acetaminophen (ULTRACET) 37.5-325 MG tablet Take 2 tablets by mouth every 6 (six) hours as needed. 02/26/20   Eustace Moore, MD  diphenhydrAMINE (BENADRYL) 25 MG tablet Take 1 tablet (25 mg total) by mouth every 6 (six) hours as needed. Patient not taking: Reported on 02/23/2020 12/04/19 02/23/20  Garlon Hatchet, PA-C  famotidine (PEPCID) 20 MG tablet Take 1 tablet (20 mg total) by mouth 2 (two) times daily. Patient not taking: Reported on 02/23/2020 01/05/20 02/23/20  Garlon Hatchet, PA-C    Family History Family  History  Family history unknown: Yes    Social History Social History   Tobacco Use   Smoking status: Former Smoker   Smokeless tobacco: Never Used  Substance Use Topics   Alcohol use: No   Drug use: No     Allergies   Penicillins and Salonpas pain relief patch [thera-gesic]   Review of Systems Review of Systems See HPI  Physical Exam Triage Vital Signs ED Triage Vitals [02/26/20 2008]  Enc Vitals Group     BP (!) 143/85     Pulse Rate (!) 101     Resp 16     Temp 98.7 F (37.1 C)     Temp src      SpO2 92 %     Weight      Height      Head Circumference      Peak Flow      Pain Score 10     Pain Loc      Pain Edu?      Excl. in GC?    No data found.  Updated Vital Signs BP (!) 143/85    Pulse (!) 101    Temp 98.7 F (37.1 C)    Resp 16    SpO2 92%      Physical Exam Constitutional:      General: He is not in acute distress.  Appearance: He is well-developed.  HENT:     Head: Normocephalic and atraumatic.     Mouth/Throat:     Comments: Mask in place Eyes:     Conjunctiva/sclera: Conjunctivae normal.     Pupils: Pupils are equal, round, and reactive to light.  Cardiovascular:     Rate and Rhythm: Normal rate.  Pulmonary:     Effort: Pulmonary effort is normal. No respiratory distress.  Abdominal:     General: There is no distension.     Palpations: Abdomen is soft.  Musculoskeletal:        General: Normal range of motion.     Cervical back: Normal range of motion.     Comments: Rash around right shoulder.  Skin is friable and feeling.  Hyperpigmented.  Shoulder is tender in the subacromial spaces.  He can only raise shoulder 15 to 20 degrees away from body.  Pain with internal and external rotation.  Distal exam is normal  Skin:    General: Skin is warm and dry.  Neurological:     General: No focal deficit present.     Mental Status: He is alert and oriented to person, place, and time.     Deep Tendon Reflexes: Reflexes normal.    Psychiatric:        Mood and Affect: Mood normal.        Behavior: Behavior normal.      UC Treatments / Results  Labs (all labs ordered are listed, but only abnormal results are displayed) Labs Reviewed - No data to display  EKG   Radiology No results found.  Procedures Procedures (including critical care time)  Medications Ordered in UC Medications - No data to display  Initial Impression / Assessment and Plan / UC Course  I have reviewed the triage vital signs and the nursing notes.  Pertinent labs & imaging results that were available during my care of the patient were reviewed by me and considered in my medical decision making (see chart for details).     Patient has shoulder bursitis/tendinopathy.  Limited movement.  We will give oral steroids, pain management, reviewed range of motion exercises.  Follow-up with sports medicine Final Clinical Impressions(s) / UC Diagnoses   Final diagnoses:  Chronic right shoulder pain     Discharge Instructions     Take the Medrol Dosepak as directed This is the "cortisone" medicine that will take down the inflammation in your shoulder Take the pain medicine as needed.  Take 2 at bedtime to help you sleep You should see gradual improvement in your shoulder for the next few days Consider sports medicine consult if your pain persists.  They can do injections   ED Prescriptions    Medication Sig Dispense Auth. Provider   methylPREDNISolone (MEDROL DOSEPAK) 4 MG TBPK tablet tad 21 tablet Eustace Moore, MD   traMADol-acetaminophen (ULTRACET) 37.5-325 MG tablet Take 2 tablets by mouth every 6 (six) hours as needed. 20 tablet Eustace Moore, MD     I have reviewed the PDMP during this encounter.   Eustace Moore, MD 02/26/20 [phone]

## 2020-02-26 NOTE — ED Triage Notes (Signed)
Pt c/o chronic right shoulder pain, requesting Cortizone injection

## 2020-02-26 NOTE — Discharge Instructions (Signed)
Take the Medrol Dosepak as directed This is the "cortisone" medicine that will take down the inflammation in your shoulder Take the pain medicine as needed.  Take 2 at bedtime to help you sleep You should see gradual improvement in your shoulder for the next few days Consider sports medicine consult if your pain persists.  They can do injections

## 2020-03-02 ENCOUNTER — Other Ambulatory Visit: Payer: Self-pay

## 2020-03-02 ENCOUNTER — Encounter (HOSPITAL_COMMUNITY): Payer: Self-pay | Admitting: Emergency Medicine

## 2020-03-02 ENCOUNTER — Ambulatory Visit (HOSPITAL_COMMUNITY)
Admission: EM | Admit: 2020-03-02 | Discharge: 2020-03-02 | Disposition: A | Discharge status: Home / Self Care | Payer: Self-pay

## 2020-03-02 DIAGNOSIS — M25511 Pain in right shoulder: Secondary | ICD-10-CM

## 2020-03-02 NOTE — Discharge Instructions (Signed)
Return if any problems.

## 2020-03-02 NOTE — ED Triage Notes (Signed)
Pt presents to [facility] for follow-up to his right shoulder pain to return to work.

## 2020-03-02 NOTE — ED Provider Notes (Signed)
[facility]    CSN: [id number] Arrival date & time: 03/02/20  1704      History   Chief Complaint Chief Complaint  Patient presents with  . Shoulder Pain    HPI Jerome Griffith is a 59 y.o. male.   Pt here for a note to return to work.  Pt states he works 4 on and 4 off.  Pt reports shoulder pain has resolved.  He has been taking prednisone   The history is provided by the patient. No language interpreter was used.  Shoulder Pain   History reviewed. No pertinent past medical history.  There are no problems to display for this patient.   History reviewed. No pertinent surgical history.     Home Medications    Prior to Admission medications   Medication Sig Start Date End Date Taking? Authorizing Provider  Ibuprofen-Acetaminophen (ADVIL DUAL ACTION) 125-250 MG TABS Take 2 tablets by mouth every 8 (eight) hours as needed (fever, chills, pain).    [provider]  methylPREDNISolone (MEDROL DOSEPAK) 4 MG TBPK tablet tad 02/26/20   Eustace Moore, MD  traMADol-acetaminophen (ULTRACET) 37.5-325 MG tablet Take 2 tablets by mouth every 6 (six) hours as needed. 02/26/20   Eustace Moore, MD  diphenhydrAMINE (BENADRYL) 25 MG tablet Take 1 tablet (25 mg total) by mouth every 6 (six) hours as needed. Patient not taking: Reported on 02/23/2020 12/04/19 02/23/20  Garlon Hatchet, PA-C  famotidine (PEPCID) 20 MG tablet Take 1 tablet (20 mg total) by mouth 2 (two) times daily. Patient not taking: Reported on 02/23/2020 01/05/20 02/23/20  Garlon Hatchet, PA-C    Family History Family History  Family history unknown: Yes    Social History Social History   Tobacco Use  . Smoking status: Former Games developer  . Smokeless tobacco: Never Used  Substance Use Topics  . Alcohol use: No  . Drug use: No     Allergies   Penicillins and Salonpas pain relief patch [thera-gesic]   Review of Systems Review of Systems  All other systems reviewed and are  negative.    Physical Exam Triage Vital Signs ED Triage Vitals [03/02/20 1816]  Enc Vitals Group     BP 133/87     Pulse Rate 86     Resp 18     Temp 98 F (36.7 C)     Temp Source Oral     SpO2 100 %     Weight      Height      Head Circumference      Peak Flow      Pain Score 0     Pain Loc      Pain Edu?      Excl. in GC?    No data found.  Updated Vital Signs BP 133/87 (BP Location: Right Arm)   Pulse 86   Temp 98 F (36.7 C) (Oral)   Resp 18   SpO2 100%   Visual Acuity Right Eye Distance:   Left Eye Distance:   Bilateral Distance:    Right Eye Near:   Left Eye Near:    Bilateral Near:     Physical Exam Vitals and nursing note reviewed.  Constitutional:      Appearance: He is well-developed.  HENT:     Head: Normocephalic and atraumatic.  Eyes:     Conjunctiva/sclera: Conjunctivae normal.  Cardiovascular:     Rate and Rhythm: Normal rate.     Heart sounds:  No murmur heard.   Pulmonary:     Effort: Pulmonary effort is normal. No respiratory distress.  Abdominal:     Palpations: Abdomen is soft.     Tenderness: There is no abdominal tenderness.  Musculoskeletal:        General: Normal range of motion.     Comments: From right shoulder  Skin:    General: Skin is warm and dry.  Neurological:     Mental Status: He is alert.      UC Treatments / Results  Labs (all labs ordered are listed, but only abnormal results are displayed) Labs Reviewed - No data to display  EKG   Radiology No results found.  Procedures Procedures (including critical care time)  Medications Ordered in UC Medications - No data to display  Initial Impression / Assessment and Plan / UC Course  I have reviewed the triage vital signs and the nursing notes.  Pertinent labs & imaging results that were available during my care of the patient were reviewed by me and considered in my medical decision making (see chart for details).     MDM:  Pt given a note to  return on his regular 1st work day Final Clinical Impressions(s) / UC Diagnoses   Final diagnoses:  Right shoulder pain, unspecified chronicity   Discharge Instructions   None    ED Prescriptions    None     PDMP not reviewed this encounter.  An After Visit Summary was printed and given to the patient.    Elson Areas, [place] 03/02/20 1848

## 2020-11-23 IMAGING — DX DG CHEST 1V PORT
1 series · 1 of 1 positions shown · non-contrast
Comparison: None.

CLINICAL DATA: 59-year-old male with fever.

EXAM:
PORTABLE CHEST 1 VIEW

[chest]
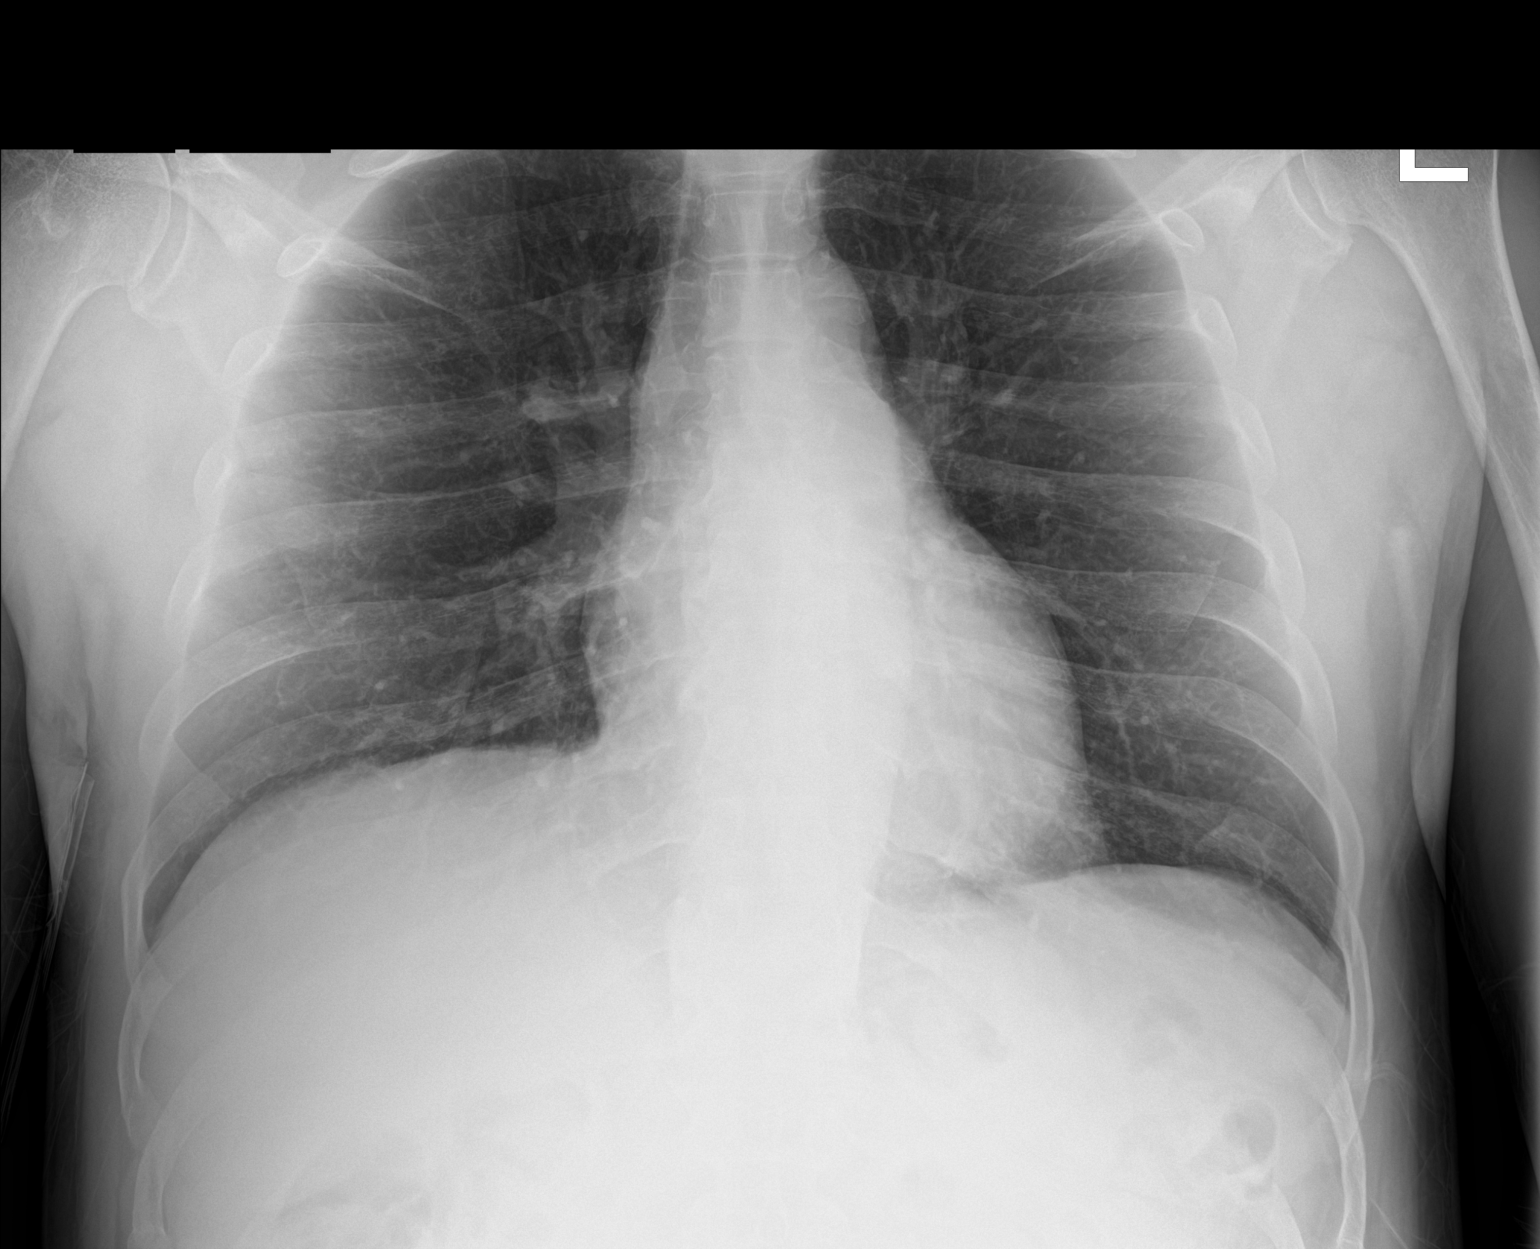

[1 of 1 positions shown; findings below may reference images not displayed]

FINDINGS: No focal consolidation, pleural effusion, or pneumothorax. The
cardiac silhouette is within limits. No acute osseous pathology.
IMPRESSION: No active disease.
